# Patient Record
Sex: Female | Born: 1955 | Race: White | Hispanic: No
Health system: Southern US, Community
[De-identification: ages and names within clinical notes are randomized; demographics above are authoritative.]

---

## 2003-12-24 ENCOUNTER — Encounter: Admission: RE | Admit: 2003-12-24 | Discharge: 2003-12-24 | Payer: Self-pay | Admitting: Family Medicine

## 2005-11-01 IMAGING — CT CT EXTREM LOW W/O CM*R*
2 of 4 series · 8 of 14 positions shown, 10 images · IV contrast (agent unspecified)
Comparison: none

CLINICAL DATA: Follow up calcaneal fracture and possible cuboid fracture.  Post fall injury seven days ago. 
 *This report includes all associated exams.
 CT RIGHT FOOT W/O CONTRAST:
 High resolution bone algorithm technique axial images were obtained with short axis to the mid and hind foot at 2.5mm collimation.  Comparison is made with [REDACTED] [REDACTED] radiographic report right foot of 12/19/03.  As questioned on previous radiographic report, CT does confirm intra-articular essentially non displaced comminuted anterior lateral calcaneal fracture.  No loose intra-articular bodies are seen.  Two tiny minimally lateral displaced fracture fragments are seen (image 58 ? 53).  No cuboid fracture is appreciated.  No other acute fracture subluxation is seen.

[Series 2: ankle lower ext · axial · 0.31mm/px · z∈[-102,-30]mm · 2 of 174 slices shown]
[im 58/174  bone]
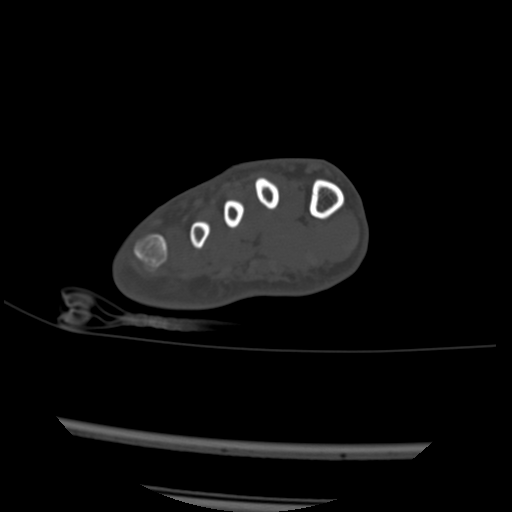
[im 116/174  bone]
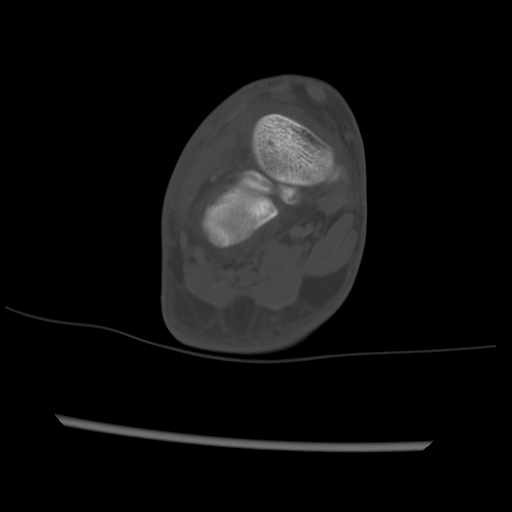

[Series 3: recon 2: ankle lower ext · axial · 0.31mm/px · z∈[-142,+11]mm · 6 of 346 slices shown, 8 images]
[im 50/346  soft-tissue]
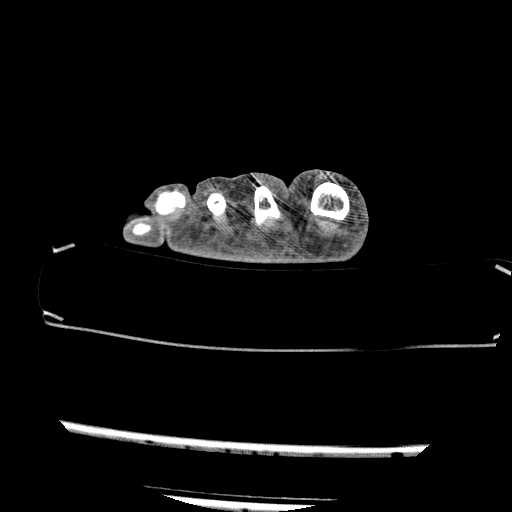
[im 50/346  bone]
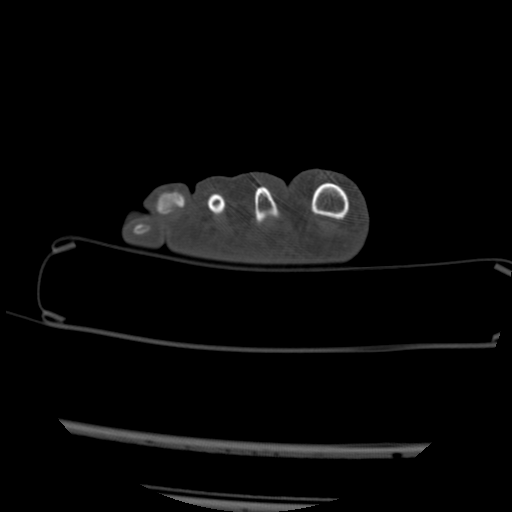
[im 99/346  bone]
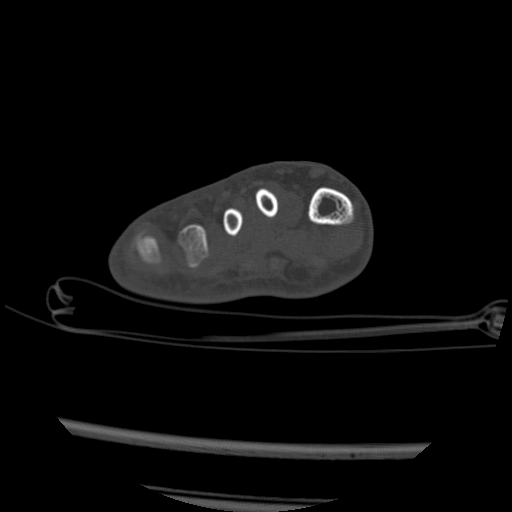
[im 148/346  bone]
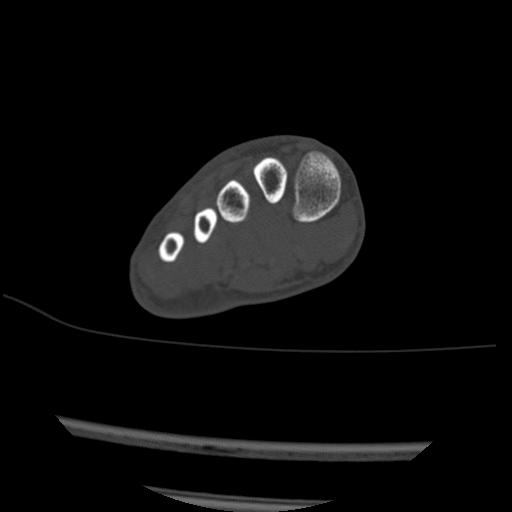
[im 198/346  bone]
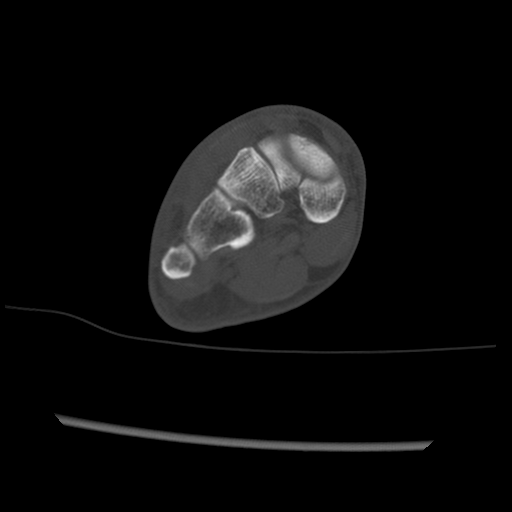
[im 247/346  soft-tissue]
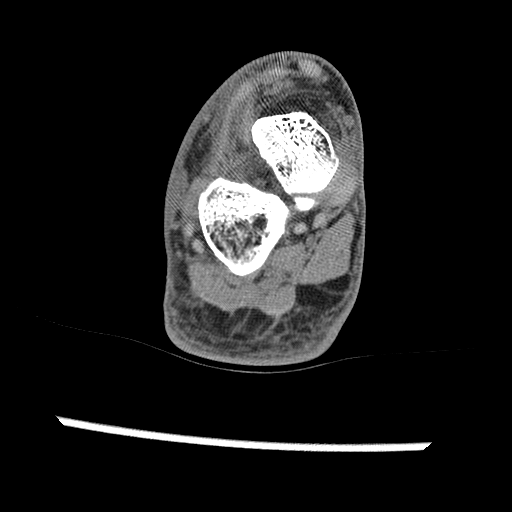
[im 247/346  bone]
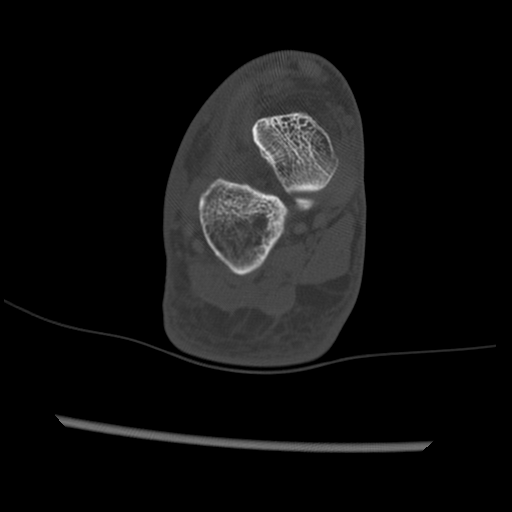
[im 296/346  bone]
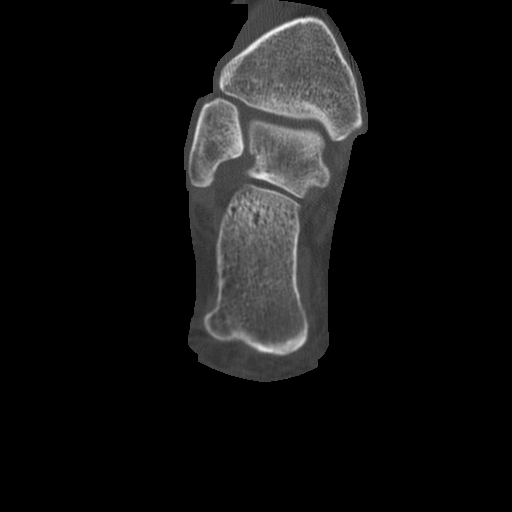

[8 of 14 positions shown; findings below may reference images not displayed]

IMPRESSION: 1.  Anterolateral calcaneal fracture as described. 
 2.  Otherwise no fracture subluxation seen. 
 3.  No loose intra-articular bodies are seen related to the calcaneal fracture. 
 CT MULTIPLANAR RECONSTRUCTION: 
 From source axial images sagittal and coronal CT multiplanar reformatted images demonstrate findings as described in axial CT report of same day.

## 2007-02-03 ENCOUNTER — Ambulatory Visit: Payer: Self-pay | Admitting: Gastroenterology

## 2007-02-17 ENCOUNTER — Ambulatory Visit: Payer: Self-pay | Admitting: Gastroenterology

## 2007-10-05 ENCOUNTER — Emergency Department (HOSPITAL_COMMUNITY): Admission: EM | Admit: 2007-10-05 | Discharge: 2007-10-06 | Payer: Self-pay | Admitting: Emergency Medicine

## 2010-02-11 IMAGING — CT CT HEAD W/O CM
2 series · 15 of 30 positions shown, 19 images · non-contrast
Comparison: NONE

CLINICAL DATA: Closed head trauma. 

CT HEAD WITHOUT INTRAVENOUS CONTRAST
TECHNIQUE: Axial 5 millimeter thick slices were obtained through 
the posterior fossa and 5 millimeter thick slices were obtained 
through the remaining portion of the head without intravenous 
contrast.

[Series 2: without contrast · axial · non-contrast · 0.46mm/px · z∈[+241,+383]mm · 13 of 34 slices shown, 17 images]
[im 3/34  brain]
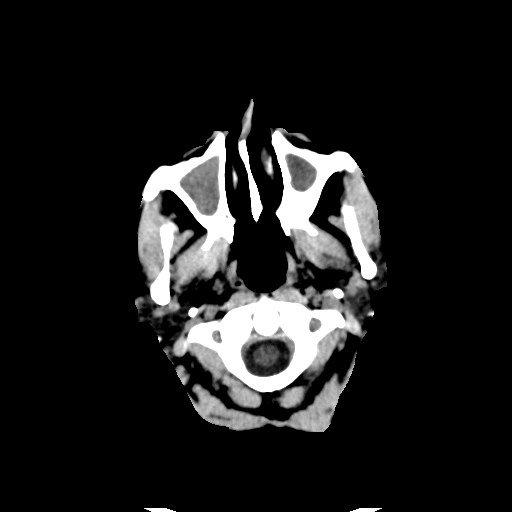
[im 3/34  bone]
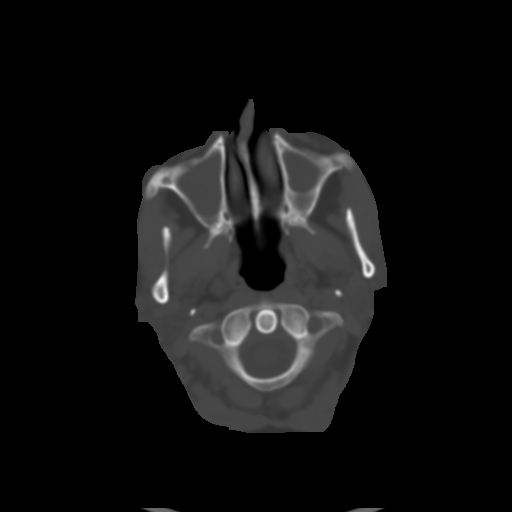
[im 5/34  brain]
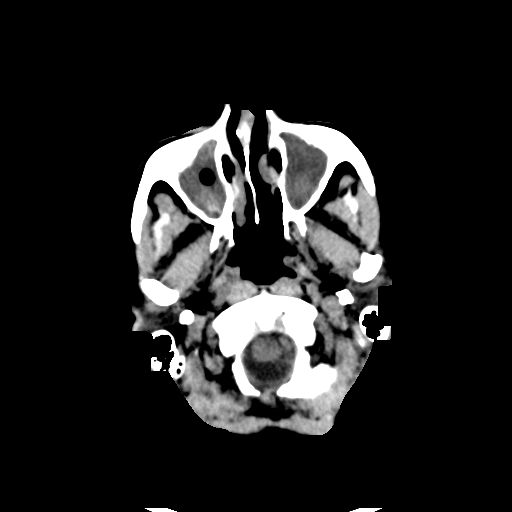
[im 8/34  brain]
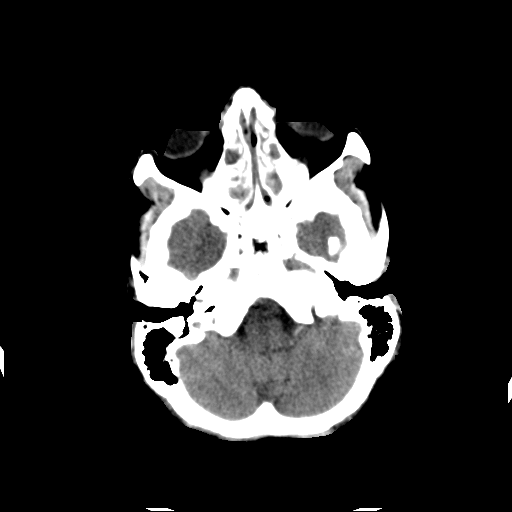
[im 10/34  brain]
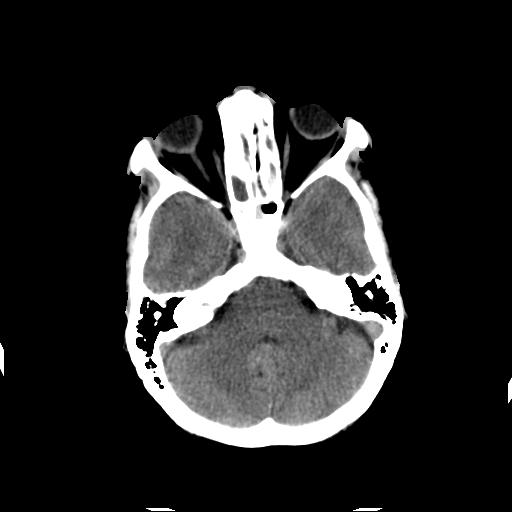
[im 12/34  brain]
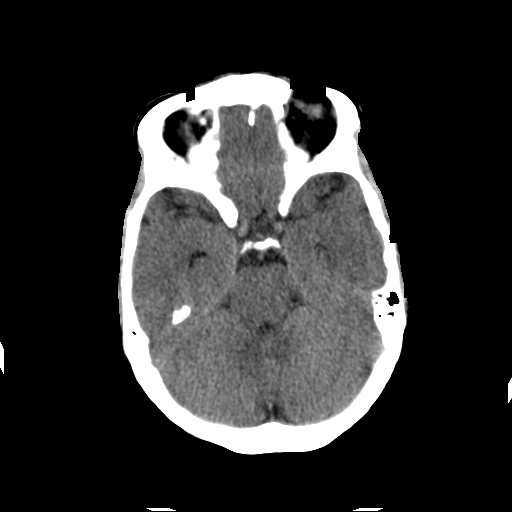
[im 12/34  bone]
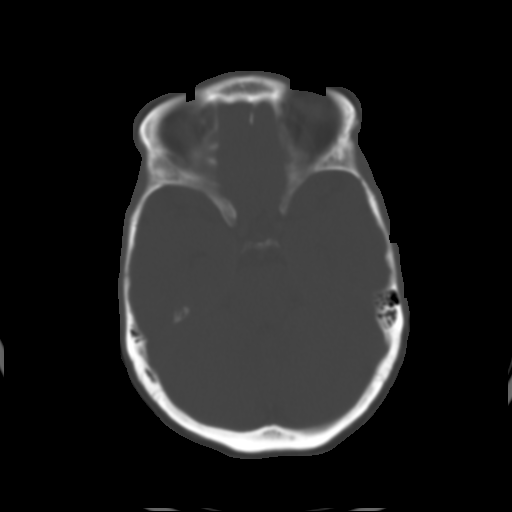
[im 15/34  brain]
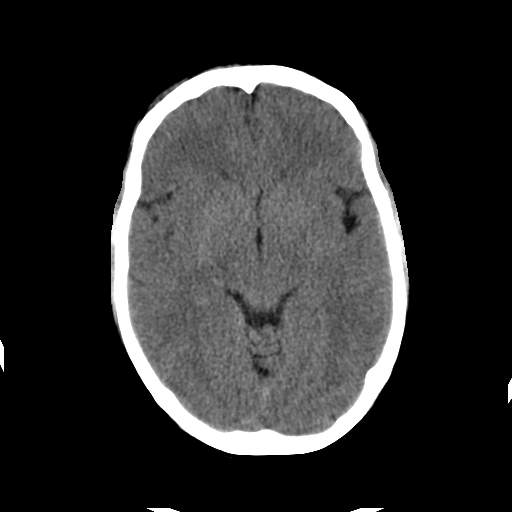
[im 17/34  brain]
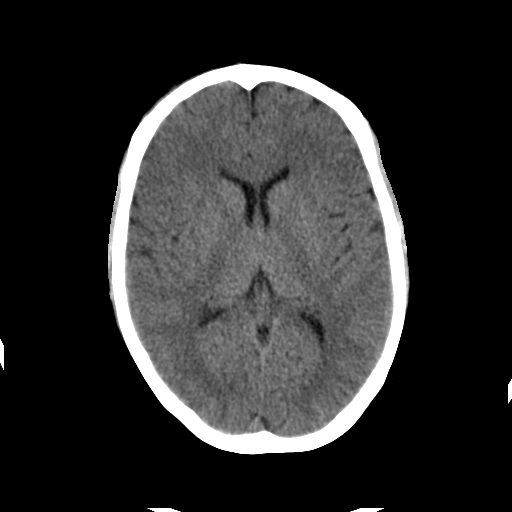
[im 19/34  brain]
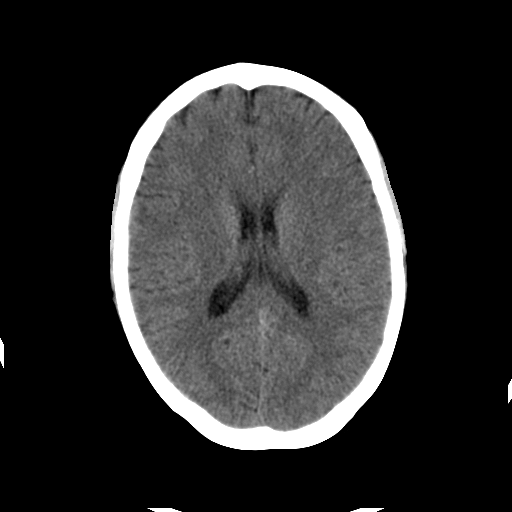
[im 22/34  brain]
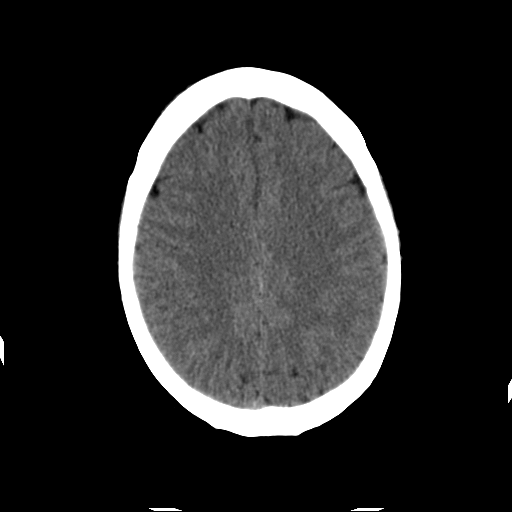
[im 22/34  bone]
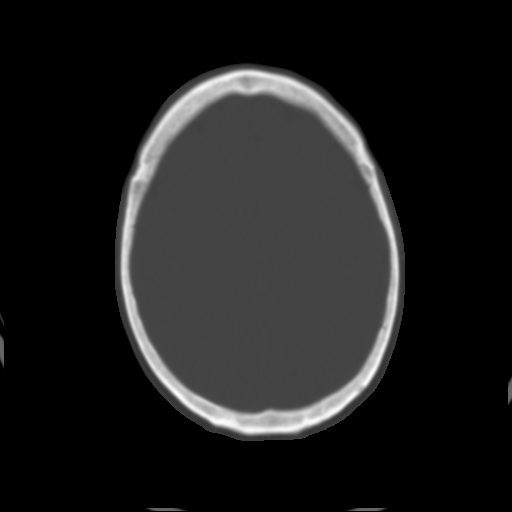
[im 24/34  brain]
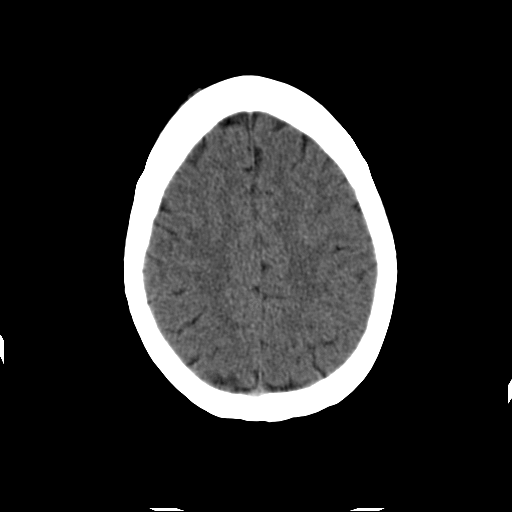
[im 26/34  brain]
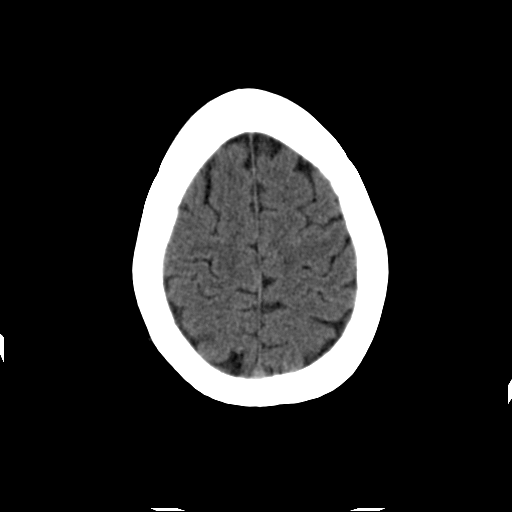
[im 29/34  brain]
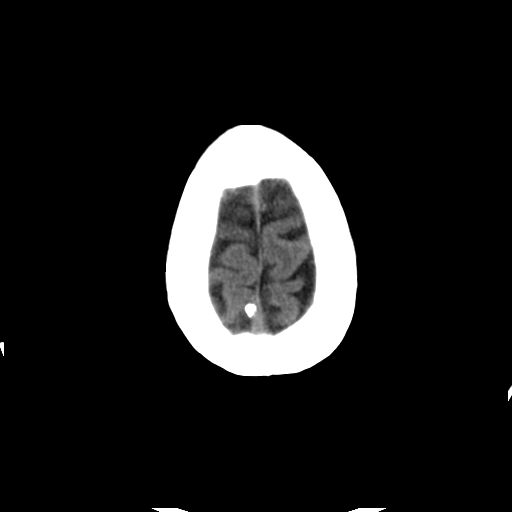
[im 31/34  brain]
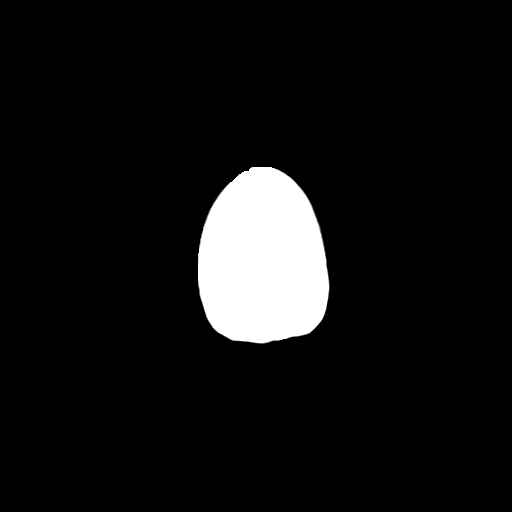
[im 31/34  bone]
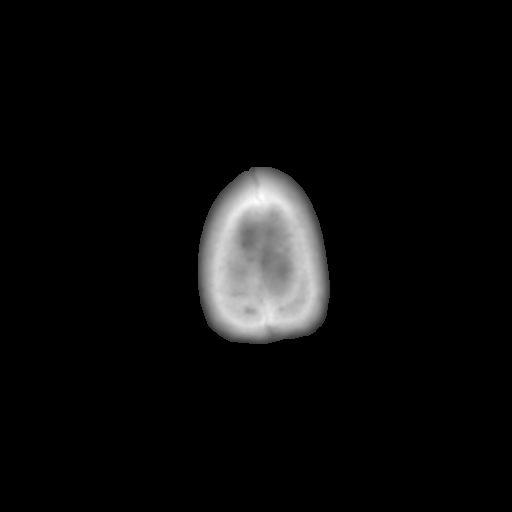

[Series 3: bone windows · axial · 0.46mm/px · z∈[+241,+267]mm · 2 of 34 slices shown]
[im 3/34  bone]
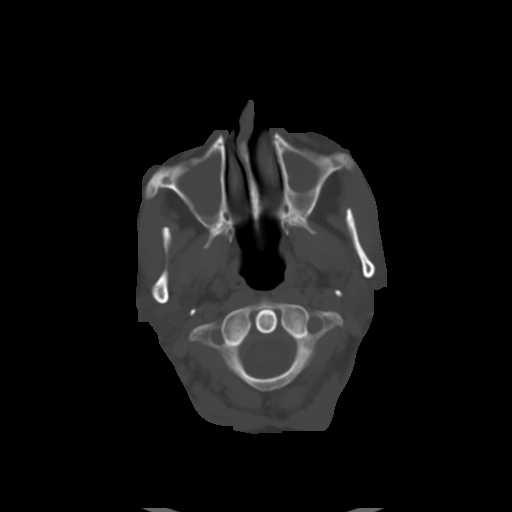
[im 8/34  bone]
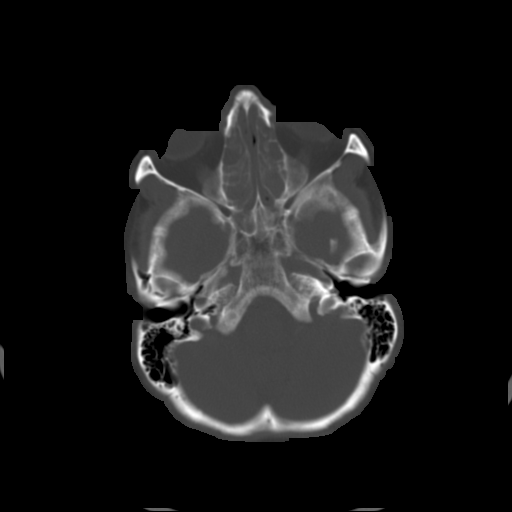

[15 of 30 positions shown; findings below may reference images not displayed]

FINDINGS: There is total opacification of the paranasal sinuses 
except for the frontal sinus, which is aplastic.  The orbits and 
mastoids are unremarkable. The fourth, third and both lateral 
ventricles are identified.  There is no evidence of midline shift 
or mass effect infratentorially or supratentorially. No areas of 
abnormal radiolucency or radiodensity are identified. There is no 
evidence of subarachnoid or intracerebral hemorrhage.  There is no 
evidence of subdural or epidural hematoma. The calvarium is 
intact.
IMPRESSION: Maxillary, ethmoid, and sphenoid 
electronically reviewed on 04/04/2008 Dict Date: 04/04/2008  Tran 
Date:  04/04/2008 DAS  [REDACTED]

## 2011-03-02 LAB — DIFFERENTIAL
Basophils Absolute: 0
Basophils Relative: 0
Eosinophils Absolute: 0
Eosinophils Relative: 0
Lymphocytes Relative: 13
Lymphs Abs: 0.9
Monocytes Absolute: 0.4
Monocytes Relative: 5
Neutro Abs: 6.2
Neutrophils Relative %: 82 — ABNORMAL HIGH

## 2011-03-02 LAB — COMPREHENSIVE METABOLIC PANEL WITH GFR
Alkaline Phosphatase: 116
BUN: 12
Creatinine, Ser: 0.7
GFR calc Af Amer: >60
Glucose, Bld: 122 — ABNORMAL HIGH
Potassium: 4
Sodium: 141
Total Protein: 6.3

## 2011-03-02 LAB — URINALYSIS, ROUTINE W REFLEX MICROSCOPIC
Bilirubin Urine: NEGATIVE
Glucose, UA: NEGATIVE
Ketones, ur: NEGATIVE
Leukocytes, UA: NEGATIVE
Nitrite: NEGATIVE
Protein, ur: NEGATIVE
Specific Gravity, Urine: 1.013
Urobilinogen, UA: 0.2
pH: 6

## 2011-03-02 LAB — CBC
HCT: 34 — ABNORMAL LOW
Hemoglobin: 11.4 — ABNORMAL LOW
MCHC: 33.7
MCV: 94.7
Platelets: 338
RBC: 3.59 — ABNORMAL LOW
RDW: 12.8
WBC: 7.6

## 2011-03-02 LAB — URINE MICROSCOPIC-ADD ON

## 2011-03-02 LAB — POCT CARDIAC MARKERS
CKMB, poc: <1 — ABNORMAL LOW
Myoglobin, poc: 32.8
Operator id: 295021
Troponin i, poc: <0.05

## 2011-03-02 LAB — COMPREHENSIVE METABOLIC PANEL
ALT: 25
AST: 21
Albumin: 3.6
CO2: 24
Calcium: 9.1
Chloride: 106
GFR calc non Af Amer: >60
Total Bilirubin: 0.3

## 2011-03-02 LAB — LIPASE, BLOOD: Lipase: 25

## 2011-03-02 LAB — D-DIMER, QUANTITATIVE: D-Dimer, Quant: <0.22

## 2014-11-15 ENCOUNTER — Telehealth: Payer: Self-pay

## 2014-11-15 NOTE — Telephone Encounter (Signed)
Patient was last seen in 2009. [facility]    She recently took a random drug test.  Three days prior she had taken some "OLD prescription of Fiorinal".      She is sending a request for release of medical records via fax.   [phone]

## 2017-03-22 ENCOUNTER — Encounter: Payer: Self-pay | Admitting: Gastroenterology
